# Patient Record
Sex: Female | Born: 2000 | Race: Black or African American | Hispanic: No | Marital: Single | Smoking: Never smoker
Health system: Southern US, Community
[De-identification: ages and names within clinical notes are randomized; demographics above are authoritative.]

---

## 2019-06-21 ENCOUNTER — Encounter (HOSPITAL_COMMUNITY): Payer: Self-pay

## 2019-06-21 ENCOUNTER — Other Ambulatory Visit: Payer: Self-pay

## 2019-06-21 ENCOUNTER — Ambulatory Visit (HOSPITAL_COMMUNITY)
Admission: EM | Admit: 2019-06-21 | Discharge: 2019-06-21 | Disposition: A | Discharge status: Home / Self Care | Attending: Family Medicine | Admitting: Family Medicine

## 2019-06-21 DIAGNOSIS — Z20828 Contact with and (suspected) exposure to other viral communicable diseases: Secondary | ICD-10-CM | POA: Diagnosis not present

## 2019-06-21 DIAGNOSIS — J029 Acute pharyngitis, unspecified: Secondary | ICD-10-CM | POA: Diagnosis not present

## 2019-06-21 DIAGNOSIS — R6889 Other general symptoms and signs: Secondary | ICD-10-CM | POA: Insufficient documentation

## 2019-06-21 DIAGNOSIS — H9202 Otalgia, left ear: Secondary | ICD-10-CM | POA: Diagnosis not present

## 2019-06-21 DIAGNOSIS — Z20822 Contact with and (suspected) exposure to covid-19: Secondary | ICD-10-CM

## 2019-06-21 LAB — POCT RAPID STREP A: Streptococcus, Group A Screen (Direct): NEGATIVE

## 2019-06-21 NOTE — ED Provider Notes (Signed)
[facility]    CSN: [id number] Arrival date & time: 06/21/19  1452     History   Chief Complaint Chief Complaint  Patient presents with  . Appointment  . Sore Throat    HPI Marie Hernandez is a 18 y.o. female.   Patient presents today with sore throat and left ear pain x2 days.  No known exposure to strep or COVID.  She denies fever, chills, shortness of breath, cough, abdominal pain, vomiting, diarrhea, rash.  LMP: 06/20/2019.    The history is provided by the patient.    History reviewed. No pertinent past medical history.  There are no active problems to display for this patient.   History reviewed. No pertinent surgical history.  OB History   No obstetric history on file.      Home Medications    Prior to Admission medications   Not on File    Family History Family History  Problem Relation Age of Onset  . Healthy Mother   . Healthy Father     Social History Social History   Tobacco Use  . Smoking status: Never Smoker  . Smokeless tobacco: Never Used  Substance Use Topics  . Alcohol use: Not Currently  . Drug use: Not on file     Allergies   Penicillins   Review of Systems Review of Systems  Constitutional: Negative for chills and fever.  HENT: Positive for ear pain and sore throat. Negative for congestion and rhinorrhea.   Eyes: Negative for pain and visual disturbance.  Respiratory: Negative for cough and shortness of breath.   Cardiovascular: Negative for chest pain and palpitations.  Gastrointestinal: Negative for abdominal pain and vomiting.  Genitourinary: Negative for dysuria and hematuria.  Musculoskeletal: Negative for arthralgias and back pain.  Skin: Negative for color change and rash.  Neurological: Negative for seizures and syncope.  All other systems reviewed and are negative.    Physical Exam Triage Vital Signs ED Triage Vitals  Enc Vitals Group     BP 06/21/19 1522 113/70     Pulse Rate 06/21/19 1522 69      Resp 06/21/19 1522 17     Temp 06/21/19 1522 98.2 F (36.8 C)     Temp Source 06/21/19 1522 Oral     SpO2 06/21/19 1522 100 %     Weight --      Height --      Head Circumference --      Peak Flow --      Pain Score 06/21/19 1518 4     Pain Loc --      Pain Edu? --      Excl. in GC? --    No data found.  Updated Vital Signs BP 113/70 (BP Location: Left Arm)   Pulse 69   Temp 98.2 F (36.8 C) (Oral)   Resp 17   LMP 06/20/2019 (Exact Date)   SpO2 100%   Visual Acuity Right Eye Distance:   Left Eye Distance:   Bilateral Distance:    Right Eye Near:   Left Eye Near:    Bilateral Near:     Physical Exam Vitals signs and nursing note reviewed.  Constitutional:      General: She is not in acute distress.    Appearance: She is well-developed.  HENT:     Head: Normocephalic and atraumatic.     Right Ear: Tympanic membrane and ear canal normal.     Left Ear: Tympanic membrane and ear  canal normal.     Nose: Nose normal.     Mouth/Throat:     Mouth: Mucous membranes are moist.     Pharynx: Uvula midline. Posterior oropharyngeal erythema present. No oropharyngeal exudate.  Eyes:     Conjunctiva/sclera: Conjunctivae normal.  Neck:     Musculoskeletal: Neck supple.  Cardiovascular:     Rate and Rhythm: Normal rate and regular rhythm.  Pulmonary:     Effort: Pulmonary effort is normal. No respiratory distress.     Breath sounds: Normal breath sounds.  Abdominal:     Palpations: Abdomen is soft.     Tenderness: There is no abdominal tenderness. There is no guarding or rebound.  Skin:    General: Skin is warm and dry.     Findings: No rash.  Neurological:     Mental Status: She is alert.      UC Treatments / Results  Labs (all labs ordered are listed, but only abnormal results are displayed) Labs Reviewed  NOVEL CORONAVIRUS, NAA (HOSPITAL ORDER, SEND-OUT TO REF LAB)  CULTURE, GROUP A STREP [facility])  POCT RAPID STREP A    EKG   Radiology No results  found.  Procedures Procedures (including critical care time)  Medications Ordered in UC Medications - No data to display  Initial Impression / Assessment and Plan / UC Course  I have reviewed the triage vital signs and the nursing notes.  Pertinent labs & imaging results that were available during my care of the patient were reviewed by me and considered in my medical decision making (see chart for details).  Clinical Course as of Jun 20 1641  Tue Jun 21, 2019  1603 POCT Rapid Strep A [KT]  1606 POCT Rapid Strep A [KT]  1610 POCT Rapid Strep A [KT]  1612 POCT Rapid Strep A [KT]  1613 Novel Coronavirus, NAA (hospital order; send-out to ref lab) [KT]  1613 POCT Rapid Strep A [KT]  1623 POCT Rapid Strep A [KT]  1628 POCT Rapid Strep A [KT]    Clinical Course User Index [KT] Sharion Balloon, NP   Sore throat and suspected COVID.  Rapid strep was negative; culture pending.  COVID test performed here and pending.  Discussed with patient that she should self quarantine until her COVID test results are back and are negative.  Discussed that we will call her if her strep culture comes back needing treatment.  Instructed patient to go to the emergency department if she develops a high fever, shortness of breath, productive cough, severe diarrhea, or other concerning symptoms.     Final Clinical Impressions(s) / UC Diagnoses   Final diagnoses:  Sore throat  Suspected Covid-19 Virus Infection     Discharge Instructions     Your rapid strep test was negative; a culture is pending.  We will call you if the culture is positive requiring treatment.    Your COVID test is pending.  You should self quarantine until your test results are back and are negative.    To the emergency department if you develop a high fever, shortness of breath, cough producing phlegm, severe diarrhea, or other concerning symptoms.        ED Prescriptions    None     Controlled Substance Prescriptions [place]  Controlled Substance Registry consulted? Not Applicable   Sharion Balloon, NP 06/21/19 1643

## 2019-06-21 NOTE — Discharge Instructions (Addendum)
Your rapid strep test was negative; a culture is pending.  We will call you if the culture is positive requiring treatment.    Your COVID test is pending.  You should self quarantine until your test results are back and are negative.    To the emergency department if you develop a high fever, shortness of breath, cough producing phlegm, severe diarrhea, or other concerning symptoms.

## 2019-06-21 NOTE — ED Triage Notes (Signed)
Patient presents to Urgent Care with complaints of first left ear pain, followed by sore throat since 2 days ago. Patient reports no fevers, body aches, or chills.

## 2019-06-23 ENCOUNTER — Telehealth (HOSPITAL_COMMUNITY): Payer: Self-pay | Admitting: Emergency Medicine

## 2019-06-23 LAB — CULTURE, GROUP A STREP (THRC)

## 2019-06-23 LAB — NOVEL CORONAVIRUS, NAA (HOSP ORDER, SEND-OUT TO REF LAB; TAT 18-24 HRS): SARS-CoV-2, NAA: NOT DETECTED

## 2019-06-23 NOTE — Telephone Encounter (Signed)
Negative covid, shows Non group A strep, contacted patient about al results, pt states she is taking mucinex which relieves her throat. Treatment not indicated at this time. All questions answered.

## 2019-07-11 ENCOUNTER — Ambulatory Visit (HOSPITAL_COMMUNITY)
Admission: EM | Admit: 2019-07-11 | Discharge: 2019-07-11 | Disposition: A | Discharge status: Home / Self Care | Attending: Family Medicine | Admitting: Family Medicine

## 2019-07-11 ENCOUNTER — Encounter (HOSPITAL_COMMUNITY): Payer: Self-pay | Admitting: Emergency Medicine

## 2019-07-11 ENCOUNTER — Other Ambulatory Visit: Payer: Self-pay

## 2019-07-11 DIAGNOSIS — J029 Acute pharyngitis, unspecified: Secondary | ICD-10-CM | POA: Diagnosis not present

## 2019-07-11 LAB — POCT RAPID STREP A: Streptococcus, Group A Screen (Direct): NEGATIVE

## 2019-07-11 MED ORDER — AZITHROMYCIN 250 MG PO TABS: 250.0000 mg | ORAL_TABLET | Freq: Every day | ORAL | 0 refills | Status: AC

## 2019-07-11 NOTE — Discharge Instructions (Signed)
Take the antibiotic Zithromax as prescribed.    Rapid strep test was negative; a culture is being sent and we will call you if it is positive.    Take Tylenol or ibuprofen as needed for discomfort.    Return here or follow-up with your primary care provider if your symptoms do not improve; or if you develop fever, chills, cough, shortness of breath, vomiting, diarrhea, or other concerning symptoms.

## 2019-07-11 NOTE — ED Triage Notes (Signed)
Pt here for URI sx with sore throat; pt had negative covid and negative strep on 7/20; pt requesting provider eval if another test is needed

## 2019-07-11 NOTE — ED Provider Notes (Signed)
[facility]    CSN: [id number] Arrival date & time: 07/11/19  1826     History   Chief Complaint Chief Complaint  Patient presents with  . Sore Throat  . URI    HPI Marie Hernandez is a 18 y.o. female.   Patient presents with ongoing sore throat and nasal congestion x2 weeks.  She also reports a fine rash on her face x1 day.  She was seen here on 06/21/2019; throat culture and COVID test were negative at that time.  She states her symptoms have not improved and are worsening.  She denies fever, chills, cough, shortness of breath, vomiting, diarrhea.     The history is provided by the patient.    History reviewed. No pertinent past medical history.  There are no active problems to display for this patient.   History reviewed. No pertinent surgical history.  OB History   No obstetric history on file.      Home Medications    Prior to Admission medications   Medication Sig Start Date End Date Taking? Authorizing Provider  azithromycin (ZITHROMAX) 250 MG tablet Take 1 tablet (250 mg total) by mouth daily. Take first 2 tablets together, then 1 every day until finished. 07/11/19   Mickie Bailate, Zakeria Kulzer H, NP    Family History Family History  Problem Relation Age of Onset  . Healthy Mother   . Healthy Father     Social History Social History   Tobacco Use  . Smoking status: Never Smoker  . Smokeless tobacco: Never Used  Substance Use Topics  . Alcohol use: Not Currently  . Drug use: Not on file     Allergies   Penicillins   Review of Systems Review of Systems  Constitutional: Negative for chills and fever.  HENT: Positive for congestion and sore throat. Negative for ear pain.   Eyes: Negative for pain and visual disturbance.  Respiratory: Negative for cough and shortness of breath.   Cardiovascular: Negative for chest pain and palpitations.  Gastrointestinal: Negative for abdominal pain, diarrhea and vomiting.  Genitourinary: Negative for dysuria and  hematuria.  Musculoskeletal: Negative for arthralgias and back pain.  Skin: Positive for rash. Negative for color change.  Neurological: Negative for seizures and syncope.  All other systems reviewed and are negative.    Physical Exam Triage Vital Signs ED Triage Vitals  Enc Vitals Group     BP 07/11/19 1910 121/69     Pulse --      Resp --      Temp --      Temp src --      SpO2 --      Weight --      Height --      Head Circumference --      Peak Flow --      Pain Score 07/11/19 1905 5     Pain Loc --      Pain Edu? --      Excl. in GC? --    No data found.  Updated Vital Signs BP 121/69 (BP Location: Left Arm)   LMP 06/20/2019 (Exact Date)   Visual Acuity Right Eye Distance:   Left Eye Distance:   Bilateral Distance:    Right Eye Near:   Left Eye Near:    Bilateral Near:     Physical Exam Vitals signs and nursing note reviewed.  Constitutional:      General: She is not in acute distress.    Appearance:  She is well-developed.  HENT:     Head: Normocephalic and atraumatic.     Right Ear: Tympanic membrane normal.     Left Ear: Tympanic membrane normal.     Nose: Nose normal.     Mouth/Throat:     Mouth: Mucous membranes are moist.     Pharynx: Posterior oropharyngeal erythema present. No oropharyngeal exudate.  Eyes:     Conjunctiva/sclera: Conjunctivae normal.  Neck:     Musculoskeletal: Neck supple.  Cardiovascular:     Rate and Rhythm: Normal rate and regular rhythm.     Heart sounds: No murmur.  Pulmonary:     Effort: Pulmonary effort is normal. No respiratory distress.     Breath sounds: Normal breath sounds.  Abdominal:     Palpations: Abdomen is soft.     Tenderness: There is no abdominal tenderness. There is no guarding or rebound.  Skin:    General: Skin is warm and dry.     Findings: Rash present.     Comments: Fine, flesh-colored, papular rash on face.  Neurological:     Mental Status: She is alert.      UC Treatments / Results   Labs (all labs ordered are listed, but only abnormal results are displayed) Labs Reviewed  CULTURE, GROUP A STREP [facility])  POCT RAPID STREP A    EKG   Radiology No results found.  Procedures Procedures (including critical care time)  Medications Ordered in UC Medications - No data to display  Initial Impression / Assessment and Plan / UC Course  I have reviewed the triage vital signs and the nursing notes.  Pertinent labs & imaging results that were available during my care of the patient were reviewed by me and considered in my medical decision making (see chart for details).   Acute pharyngitis.  Rapid strep negative; culture pending.  Treating today with antibiotic due to length of symptoms; patient is allergic to penicillin; treating with Zithromax.  Instructed patient to take Tylenol or ibuprofen as needed for discomfort; and Mucinex for her congestion.  Discussed with patient that she should return here or follow-up with her PCP if she develops other symptoms such as fever, chills, cough, shortness of breath, vomiting, diarrhea.     Final Clinical Impressions(s) / UC Diagnoses   Final diagnoses:  Acute pharyngitis, unspecified etiology     Discharge Instructions     Take the antibiotic Zithromax as prescribed.    Rapid strep test was negative; a culture is being sent and we will call you if it is positive.    Take Tylenol or ibuprofen as needed for discomfort.    Return here or follow-up with your primary care provider if your symptoms do not improve; or if you develop fever, chills, cough, shortness of breath, vomiting, diarrhea, or other concerning symptoms.        ED Prescriptions    Medication Sig Dispense Auth. Provider   azithromycin (ZITHROMAX) 250 MG tablet Take 1 tablet (250 mg total) by mouth daily. Take first 2 tablets together, then 1 every day until finished. 6 tablet Sharion Balloon, NP     Controlled Substance Prescriptions [place] Controlled  Substance Registry consulted? Not Applicable   Sharion Balloon, NP 07/11/19 1956

## 2019-07-14 LAB — CULTURE, GROUP A STREP (THRC)

## 2019-10-02 ENCOUNTER — Emergency Department (HOSPITAL_COMMUNITY)

## 2019-10-02 ENCOUNTER — Encounter (HOSPITAL_COMMUNITY): Payer: Self-pay | Admitting: Emergency Medicine

## 2019-10-02 ENCOUNTER — Emergency Department (HOSPITAL_COMMUNITY)
Admission: EM | Admit: 2019-10-02 | Discharge: 2019-10-02 | Disposition: A | Discharge status: Home / Self Care | Attending: Emergency Medicine | Admitting: Emergency Medicine

## 2019-10-02 ENCOUNTER — Other Ambulatory Visit: Payer: Self-pay

## 2019-10-02 DIAGNOSIS — W010XXA Fall on same level from slipping, tripping and stumbling without subsequent striking against object, initial encounter: Secondary | ICD-10-CM | POA: Insufficient documentation

## 2019-10-02 DIAGNOSIS — Y9301 Activity, walking, marching and hiking: Secondary | ICD-10-CM | POA: Diagnosis not present

## 2019-10-02 DIAGNOSIS — Y998 Other external cause status: Secondary | ICD-10-CM | POA: Insufficient documentation

## 2019-10-02 DIAGNOSIS — S99921A Unspecified injury of right foot, initial encounter: Secondary | ICD-10-CM | POA: Diagnosis not present

## 2019-10-02 DIAGNOSIS — Y92828 Other wilderness area as the place of occurrence of the external cause: Secondary | ICD-10-CM | POA: Insufficient documentation

## 2019-10-02 DIAGNOSIS — Z79899 Other long term (current) drug therapy: Secondary | ICD-10-CM | POA: Diagnosis not present

## 2019-10-02 MED ORDER — HYDROCODONE-ACETAMINOPHEN 5-325 MG PO TABS
1.0000 | ORAL_TABLET | Freq: Once | ORAL | Status: AC
  Administered 2019-10-02: 1 via ORAL
  Filled 2019-10-02: qty 1

## 2019-10-02 MED ORDER — HYDROCODONE-ACETAMINOPHEN 5-325 MG PO TABS: 1.0000 | ORAL_TABLET | Freq: Four times a day (QID) | ORAL | 0 refills | Status: AC | PRN

## 2019-10-02 NOTE — ED Triage Notes (Signed)
Patient was doing a spooky trail with boyfriend. She slipped and fell. Patient complaining of right foot pain.

## 2019-10-02 NOTE — ED Provider Notes (Signed)
[facility]-EMERGENCY DEPT Provider Note   CSN: [id number] Arrival date & time: 10/02/19  0104     History   Chief Complaint Chief Complaint  Patient presents with  . Foot Injury    HPI Marie Hernandez is a 18 y.o. female who presents for evaluation of right foot pain that began at about midnight. Patient reports that she was at a haunted woods and states that she slipped and fell and twisted her foot.  She is unsure of exactly how she twisted it.  She does report that she was able to ambulate and bear weight and finished the haunted [place].  She reports pain to the lateral and dorsal aspect of her right foot.  She is not taking medications for the pain.  She states that the pain is worse with movement.  Denies any numbness/weakness.  She denies any head injury, LOC.     The history is provided by the patient.    History reviewed. No pertinent past medical history.  There are no active problems to display for this patient.   History reviewed. No pertinent surgical history.   OB History   No obstetric history on file.      Home Medications    Prior to Admission medications   Medication Sig Start Date End Date Taking? Authorizing Provider  JUNEL FE 1/20 1-20 MG-MCG tablet Take 1 tablet by mouth daily. 06/27/19  Yes [provider]  norethindrone-ethinyl estradiol (JUNEL FE 1/20) 1-20 MG-MCG tablet TAKE 1 TABLET BY MOUTH EVERY DAY 09/09/19  Yes [provider]  azithromycin (ZITHROMAX) 250 MG tablet Take 1 tablet (250 mg total) by mouth daily. Take first 2 tablets together, then 1 every day until finished. 07/11/19   Mickie Bailate, Kelly H, NP  HYDROcodone-acetaminophen (NORCO/VICODIN) 5-325 MG tablet Take 1-2 tablets by mouth every 6 (six) hours as needed. 10/02/19   Maxwell CaulLayden, Hilari Wethington A, PA-C    Family History Family History  Problem Relation Age of Onset  . Healthy Mother   . Healthy Father     Social History Social History   Tobacco Use  .  Smoking status: Never Smoker  . Smokeless tobacco: Never Used  Substance Use Topics  . Alcohol use: Not Currently  . Drug use: Not on file     Allergies   Penicillins   Review of Systems Review of Systems  Musculoskeletal:       Foot pain  Neurological: Negative for weakness and numbness.  All other systems reviewed and are negative.    Physical Exam Updated Vital Signs BP 130/69 (BP Location: Right Arm)   Pulse 90   Temp 98.6 F (37 C) (Oral)   Resp 15   Ht 5\' 4"  (1.626 m)   Wt 84.4 kg   LMP 08/29/2019   SpO2 100%   BMI 31.93 kg/m   Physical Exam Vitals signs and nursing note reviewed.  Constitutional:      Appearance: She is well-developed.  HENT:     Head: Normocephalic and atraumatic.  Eyes:     General: No scleral icterus.       Right eye: No discharge.        Left eye: No discharge.     Conjunctiva/sclera: Conjunctivae normal.  Cardiovascular:     Pulses:          Dorsalis pedis pulses are 2+ on the right side and 2+ on the left side.  Pulmonary:     Effort: Pulmonary effort is normal.  Musculoskeletal:  Comments: Tenderness palpation noted to the lateral and dorsal aspect of the right foot.  No tenderness palpation noted to lateral or medial malleolus of right ankle.  No deformity or crepitus noted.  Dorsiflexion and plantar flexion intact but with subjective reports of pain.  She can wiggle all 5 toes without any difficulty.  No bony tenderness noted distal tib-fib, proximal tib-fib.  No tenderness palpation of left lower extremity.  Full range of left lower extremities any difficulty.  Skin:    General: Skin is warm and dry.     Comments: Good distal cap refill.  RLE is not dusky in appearance or cool to touch.  Neurological:     Mental Status: She is alert.     Comments: Sensation intact along major nerve distributions of BLE  Psychiatric:        Speech: Speech normal.        Behavior: Behavior normal.      ED Treatments / Results  Labs  (all labs ordered are listed, but only abnormal results are displayed) Labs Reviewed - No data to display  EKG None  Radiology Dg Foot Complete Right  Result Date: 10/02/2019 CLINICAL DATA:  18 year old female with trauma to the right foot. EXAM: RIGHT FOOT COMPLETE - 3+ VIEW COMPARISON:  None. FINDINGS: Nondisplaced linear lucencies through the bases of the third and fourth metatarsals, best seen on the oblique view may be artifactual but concerning for nondisplaced fractures. Correlation with clinical exam and point tenderness recommended. No other acute fracture identified. There is no dislocation. The bones are well mineralized. The soft tissues are unremarkable. IMPRESSION: Artifact versus nondisplaced fractures of the bases of the third and fourth metatarsals. Correlation with clinical exam and point tenderness recommended. Electronically Signed   By: Anner Crete M.D.   On: 10/02/2019 01:46    Procedures Procedures (including critical care time)  Medications Ordered in ED Medications  HYDROcodone-acetaminophen (NORCO/VICODIN) 5-325 MG per tablet 1 tablet (1 tablet Oral Given 10/02/19 0142)     Initial Impression / Assessment and Plan / ED Course  I have reviewed the triage vital signs and the nursing notes.  Pertinent labs & imaging results that were available during my care of the patient were reviewed by me and considered in my medical decision making (see chart for details).        18 year old female who presents for evaluation of right foot pain status post mechanical fall.  No head injury, LOC.  Has been able to ambulate bear weight. Patient is afebrile, non-toxic appearing, sitting comfortably on examination table. Vital signs reviewed and stable.  Patient is neurovascularly intact.  On exam, tenderness palpation of dorsal lateral aspect of right foot.  No tenderness palpation of the malleolus of ankles.  Concern for contusion versus sprain versus fracture.  X-rays  ordered at triage.  X-ray reviewed.  There is questionable artifact versus nondisplaced fracture at the bases of the third and fourth metatarsals.  This does correlate with the tenderness that she has on the dorsal aspect of the right foot.  We will plan to treat accordingly.  Discussed results with patient.  She is agreeable to treating this potential fracture.  We will put her in a cam walker boot.  Will give short course of pain medication for severe breakthrough pain.  Encourage follow-up with outpatient orthopedics. At this time, patient exhibits no emergent life-threatening condition that require further evaluation in ED or admission. Patient had ample opportunity for questions and discussion. All patient's  questions were answered with full understanding. Strict return precautions discussed. Patient expresses understanding and agreement to plan.   Portions of this note were generated with Scientist, clinical (histocompatibility and immunogenetics). Dictation errors may occur despite best attempts at proofreading.  Final Clinical Impressions(s) / ED Diagnoses   Final diagnoses:  Injury of right foot, initial encounter    ED Discharge Orders         Ordered    HYDROcodone-acetaminophen (NORCO/VICODIN) 5-325 MG tablet  Every 6 hours PRN     10/02/19 0154           Maxwell Caul, PA-C 10/02/19 0230    Gilda Crease, MD 10/02/19 [id number]

## 2019-10-02 NOTE — Discharge Instructions (Signed)
You can take Tylenol or Ibuprofen as directed for pain. You can alternate Tylenol and Ibuprofen every 4 hours. If you take Tylenol at 1pm, then you can take Ibuprofen at 5pm. Then you can take Tylenol again at 9pm.   Take pain medications as directed for break through pain. Do not drive or operate machinery while taking this medication.   Wear cam walker boot.  Make sure you are elevating the foot to help with pain and swelling.  Follow-up with referred orthopedic doctor.  Return the emergency department for any worsening pain, numbness/weakness, discoloration or any other worsening concerning symptoms.

## 2021-01-08 IMAGING — CR DG FOOT COMPLETE 3+V*R*
3 series · 3 of 3 positions shown · non-contrast
Comparison: None.

CLINICAL DATA: 18-year-old female with trauma to the right foot.

EXAM:
RIGHT FOOT COMPLETE - 3+ VIEW

[x foot ap right]
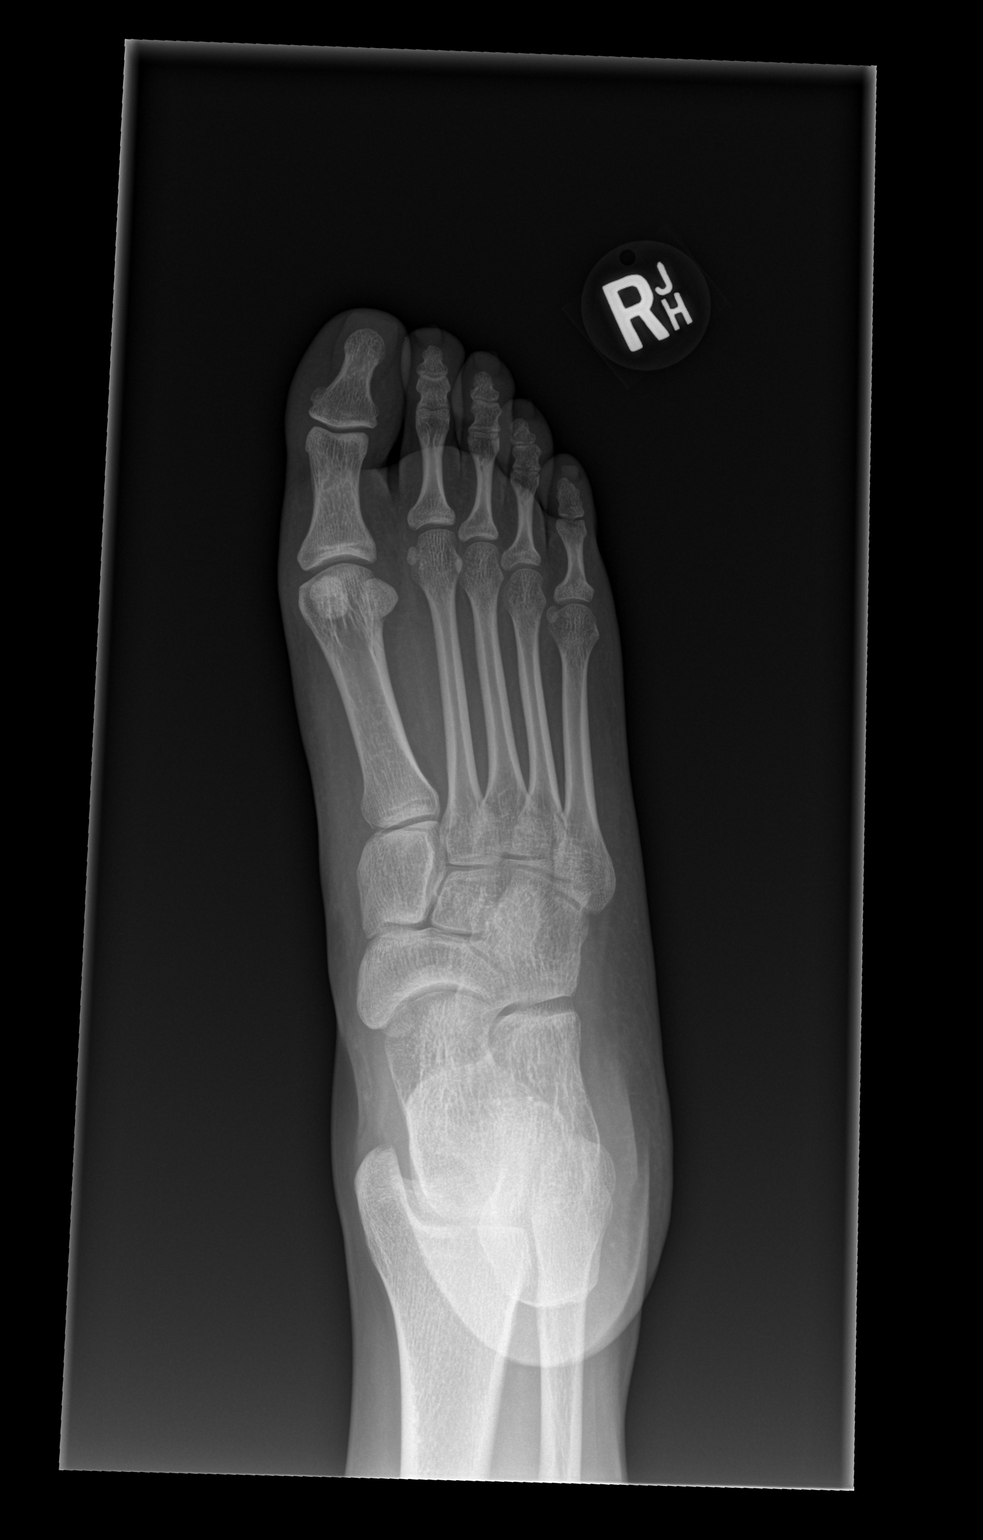

[x foot obl right]
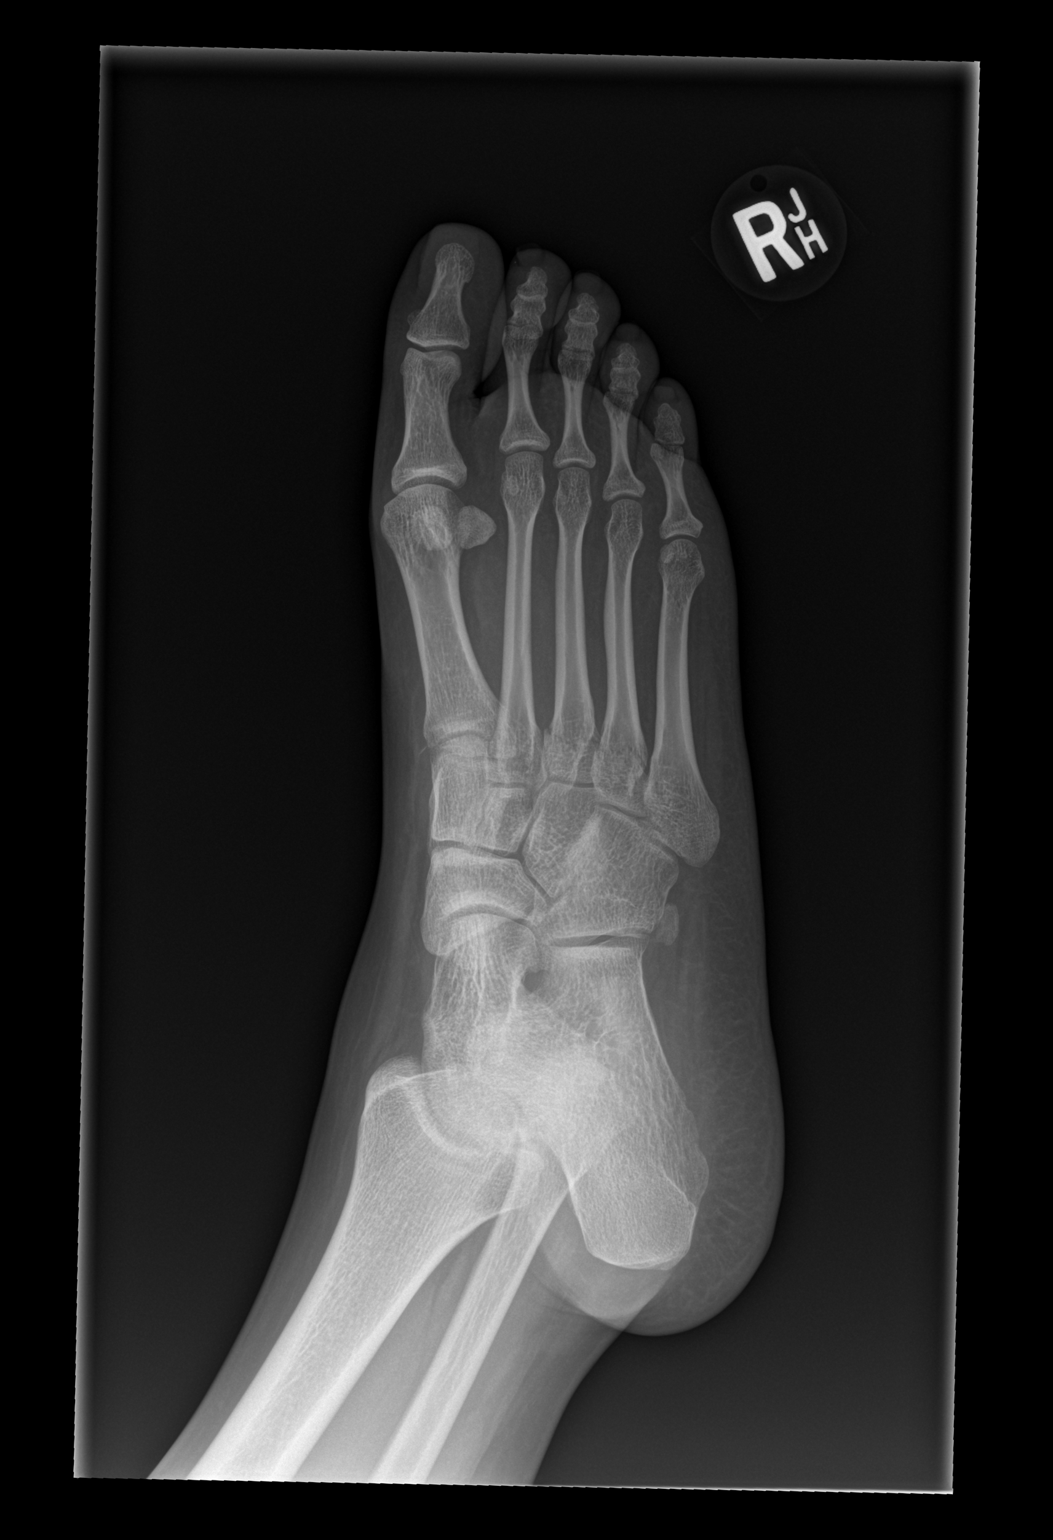

[x foot lat right]
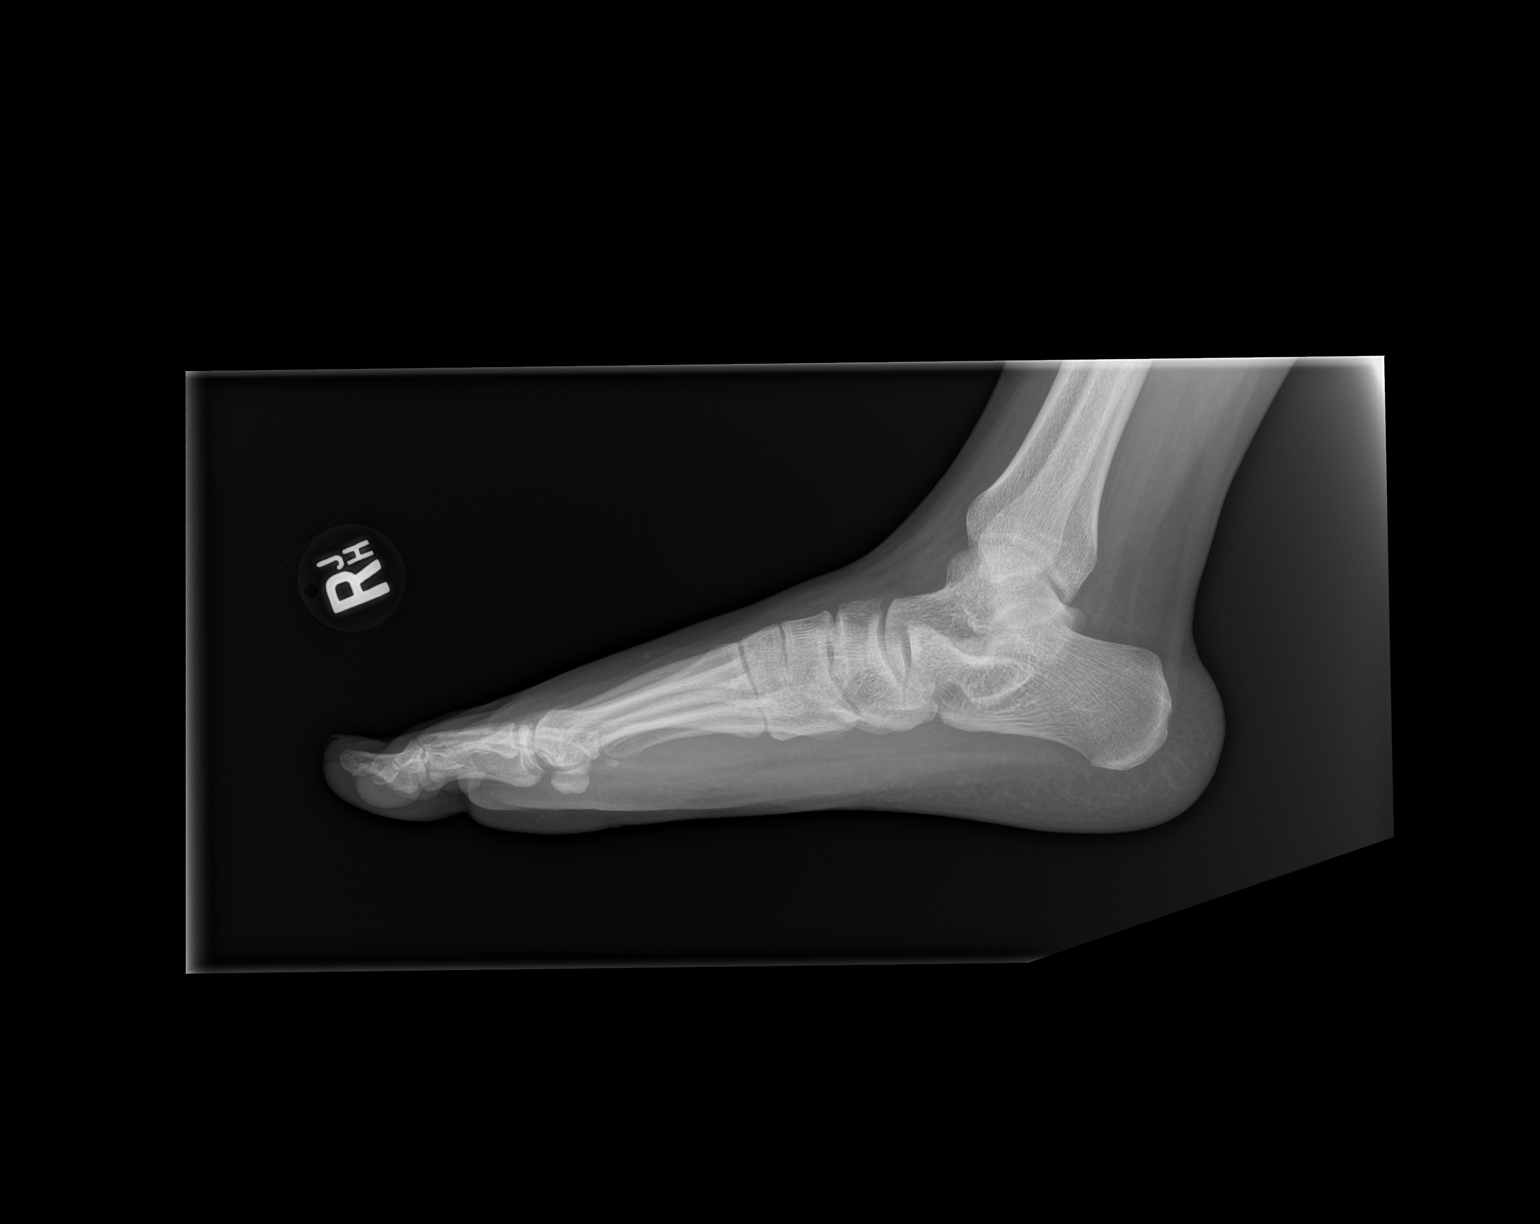

[3 of 3 positions shown; findings below may reference images not displayed]

FINDINGS: Nondisplaced linear lucencies through the bases of the third and
fourth metatarsals, best seen on the oblique view may be artifactual
but concerning for nondisplaced fractures. Correlation with clinical
exam and point tenderness recommended. No other acute fracture
identified. There is no dislocation. The bones are well mineralized.
The soft tissues are unremarkable.
IMPRESSION: Artifact versus nondisplaced fractures of the bases of the third and
fourth metatarsals. Correlation with clinical exam and point
tenderness recommended.
